# Patient Record
Sex: Male | Born: 1950 | Race: White | Hispanic: No | Marital: Married | Smoking: Former smoker
Health system: Southern US, Community
[De-identification: ages and names within clinical notes are randomized; demographics above are authoritative.]

## PROBLEM LIST (undated history)

## (undated) DIAGNOSIS — I10 Essential (primary) hypertension: Secondary | ICD-10-CM

## (undated) DIAGNOSIS — G4730 Sleep apnea, unspecified: Secondary | ICD-10-CM

## (undated) DIAGNOSIS — K219 Gastro-esophageal reflux disease without esophagitis: Secondary | ICD-10-CM

## (undated) DIAGNOSIS — R011 Cardiac murmur, unspecified: Secondary | ICD-10-CM

## (undated) DIAGNOSIS — E785 Hyperlipidemia, unspecified: Secondary | ICD-10-CM

## (undated) DIAGNOSIS — R1310 Dysphagia, unspecified: Secondary | ICD-10-CM

## (undated) DIAGNOSIS — K5792 Diverticulitis of intestine, part unspecified, without perforation or abscess without bleeding: Secondary | ICD-10-CM

## (undated) DIAGNOSIS — M5030 Other cervical disc degeneration, unspecified cervical region: Secondary | ICD-10-CM

## (undated) DIAGNOSIS — G500 Trigeminal neuralgia: Secondary | ICD-10-CM

## (undated) HISTORY — PX: KNEE ARTHROSCOPY: SHX127

## (undated) HISTORY — PX: SHOULDER ARTHROSCOPY: SHX128

## (undated) HISTORY — PX: NOSE SURGERY: SHX723

## (undated) HISTORY — PX: TYMPANOPLASTY: SHX33

## (undated) HISTORY — PX: WISDOM TOOTH EXTRACTION: SHX21

## (undated) HISTORY — PX: TOTAL SHOULDER ARTHROPLASTY: SHX126

---

## 2004-08-11 ENCOUNTER — Ambulatory Visit: Payer: Self-pay | Admitting: Family Medicine

## 2004-08-18 ENCOUNTER — Ambulatory Visit: Payer: Self-pay | Admitting: Family Medicine

## 2005-05-21 ENCOUNTER — Ambulatory Visit: Payer: Self-pay

## 2005-09-02 ENCOUNTER — Ambulatory Visit: Payer: Self-pay | Admitting: Orthopaedic Surgery

## 2005-11-27 ENCOUNTER — Ambulatory Visit: Payer: Self-pay | Admitting: Orthopaedic Surgery

## 2005-12-21 ENCOUNTER — Ambulatory Visit: Payer: Self-pay | Admitting: Orthopaedic Surgery

## 2008-10-25 ENCOUNTER — Ambulatory Visit: Payer: Self-pay | Admitting: Unknown Physician Specialty

## 2012-02-28 DEATH — deceased

## 2013-05-21 ENCOUNTER — Emergency Department: Payer: Self-pay | Admitting: Emergency Medicine

## 2013-05-21 LAB — COMPREHENSIVE METABOLIC PANEL
Albumin: 4.2 g/dL (ref 3.4–5.0)
Alkaline Phosphatase: 79 U/L (ref 50–136)
Anion Gap: 5 — ABNORMAL LOW (ref 7–16)
Bilirubin,Total: 0.3 mg/dL (ref 0.2–1.0)
Calcium, Total: 9.2 mg/dL (ref 8.5–10.1)
Chloride: 104 mmol/L (ref 98–107)
Co2: 27 mmol/L (ref 21–32)
Creatinine: 0.98 mg/dL (ref 0.60–1.30)
EGFR (African American): >60
EGFR (Non-African Amer.): >60
Glucose: 132 mg/dL — ABNORMAL HIGH (ref 65–99)
Osmolality: 274 (ref 275–301)
SGOT(AST): 32 U/L (ref 15–37)
SGPT (ALT): 30 U/L (ref 12–78)
Total Protein: 7 g/dL (ref 6.4–8.2)

## 2013-05-21 LAB — CK TOTAL AND CKMB (NOT AT ARMC)
CK, Total: 320 U/L — ABNORMAL HIGH (ref 35–232)
CK-MB: 1.4 ng/mL (ref 0.5–3.6)

## 2013-05-21 LAB — CBC
HCT: 42.1 % (ref 40.0–52.0)
Platelet: 201 10*3/uL (ref 150–440)
RBC: 4.55 10*6/uL (ref 4.40–5.90)
WBC: 9.6 10*3/uL (ref 3.8–10.6)

## 2013-05-21 LAB — TROPONIN I: Troponin-I: <0.02 ng/mL

## 2014-06-25 ENCOUNTER — Emergency Department: Payer: Self-pay | Admitting: Emergency Medicine

## 2015-06-12 ENCOUNTER — Ambulatory Visit: Payer: Federal, State, Local not specified - PPO | Attending: Otolaryngology

## 2015-06-12 DIAGNOSIS — G4733 Obstructive sleep apnea (adult) (pediatric): Secondary | ICD-10-CM | POA: Diagnosis not present

## 2015-06-12 DIAGNOSIS — R0683 Snoring: Secondary | ICD-10-CM | POA: Diagnosis not present

## 2015-06-19 ENCOUNTER — Ambulatory Visit: Payer: Federal, State, Local not specified - PPO | Attending: Otolaryngology

## 2015-06-19 DIAGNOSIS — G4733 Obstructive sleep apnea (adult) (pediatric): Secondary | ICD-10-CM | POA: Insufficient documentation

## 2015-06-19 DIAGNOSIS — R0683 Snoring: Secondary | ICD-10-CM | POA: Insufficient documentation

## 2015-08-13 ENCOUNTER — Encounter: Payer: Self-pay | Admitting: *Deleted

## 2015-08-14 ENCOUNTER — Ambulatory Visit
Admission: RE | Admit: 2015-08-14 | Discharge: 2015-08-14 | Disposition: A | Discharge status: Home / Self Care | Payer: Federal, State, Local not specified - PPO | Source: Ambulatory Visit | Attending: Unknown Physician Specialty | Admitting: Unknown Physician Specialty

## 2015-08-14 ENCOUNTER — Ambulatory Visit: Payer: Federal, State, Local not specified - PPO | Admitting: Anesthesiology

## 2015-08-14 ENCOUNTER — Encounter
Admission: RE | Disposition: A | Discharge status: Home / Self Care | Payer: Self-pay | Source: Ambulatory Visit | Attending: Unknown Physician Specialty

## 2015-08-14 ENCOUNTER — Encounter: Payer: Self-pay | Admitting: *Deleted

## 2015-08-14 DIAGNOSIS — K625 Hemorrhage of anus and rectum: Secondary | ICD-10-CM | POA: Insufficient documentation

## 2015-08-14 DIAGNOSIS — K222 Esophageal obstruction: Secondary | ICD-10-CM | POA: Insufficient documentation

## 2015-08-14 DIAGNOSIS — K449 Diaphragmatic hernia without obstruction or gangrene: Secondary | ICD-10-CM | POA: Insufficient documentation

## 2015-08-14 DIAGNOSIS — R1310 Dysphagia, unspecified: Secondary | ICD-10-CM | POA: Diagnosis not present

## 2015-08-14 DIAGNOSIS — E785 Hyperlipidemia, unspecified: Secondary | ICD-10-CM | POA: Diagnosis not present

## 2015-08-14 DIAGNOSIS — Z7982 Long term (current) use of aspirin: Secondary | ICD-10-CM | POA: Insufficient documentation

## 2015-08-14 DIAGNOSIS — I10 Essential (primary) hypertension: Secondary | ICD-10-CM | POA: Insufficient documentation

## 2015-08-14 DIAGNOSIS — Z79899 Other long term (current) drug therapy: Secondary | ICD-10-CM | POA: Diagnosis not present

## 2015-08-14 DIAGNOSIS — K640 First degree hemorrhoids: Secondary | ICD-10-CM | POA: Insufficient documentation

## 2015-08-14 DIAGNOSIS — K219 Gastro-esophageal reflux disease without esophagitis: Secondary | ICD-10-CM | POA: Insufficient documentation

## 2015-08-14 HISTORY — DX: Trigeminal neuralgia: G50.0

## 2015-08-14 HISTORY — DX: Diverticulitis of intestine, part unspecified, without perforation or abscess without bleeding: K57.92

## 2015-08-14 HISTORY — PX: ESOPHAGOGASTRODUODENOSCOPY (EGD) WITH PROPOFOL: SHX5813

## 2015-08-14 HISTORY — PX: COLONOSCOPY WITH PROPOFOL: SHX5780

## 2015-08-14 HISTORY — DX: Dysphagia, unspecified: R13.10

## 2015-08-14 HISTORY — DX: Hyperlipidemia, unspecified: E78.5

## 2015-08-14 HISTORY — DX: Gastro-esophageal reflux disease without esophagitis: K21.9

## 2015-08-14 HISTORY — DX: Essential (primary) hypertension: I10

## 2015-08-14 SURGERY — COLONOSCOPY WITH PROPOFOL
Anesthesia: General

## 2015-08-14 MED ORDER — LIDOCAINE HCL (CARDIAC) 20 MG/ML IV SOLN
INTRAVENOUS | Status: DC | PRN
  Administered 2015-08-14: 100 mg via INTRAVENOUS

## 2015-08-14 MED ORDER — PHENYLEPHRINE HCL 10 MG/ML IJ SOLN
INTRAMUSCULAR | Status: DC | PRN
  Administered 2015-08-14: 100 ug via INTRAVENOUS

## 2015-08-14 MED ORDER — SODIUM CHLORIDE 0.9 % IV SOLN
INTRAVENOUS | Status: DC
  Administered 2015-08-14: 13:00:00 via INTRAVENOUS

## 2015-08-14 MED ORDER — FENTANYL CITRATE (PF) 100 MCG/2ML IJ SOLN
INTRAMUSCULAR | Status: DC | PRN
  Administered 2015-08-14: 50 ug via INTRAVENOUS

## 2015-08-14 MED ORDER — GLYCOPYRROLATE 0.2 MG/ML IJ SOLN
INTRAMUSCULAR | Status: DC | PRN
  Administered 2015-08-14: 0.2 mg via INTRAVENOUS

## 2015-08-14 MED ORDER — MIDAZOLAM HCL 5 MG/5ML IJ SOLN
INTRAMUSCULAR | Status: DC | PRN
  Administered 2015-08-14: 1 mg via INTRAVENOUS

## 2015-08-14 MED ORDER — PROPOFOL 10 MG/ML IV BOLUS
INTRAVENOUS | Status: DC | PRN
  Administered 2015-08-14: 100 mg via INTRAVENOUS

## 2015-08-14 MED ORDER — ACETAMINOPHEN 325 MG PO TABS
650.0000 mg | ORAL_TABLET | Freq: Once | ORAL | Status: AC
  Administered 2015-08-14: 650 mg via ORAL
  Filled 2015-08-14: qty 2

## 2015-08-14 MED ORDER — SODIUM CHLORIDE 0.9 % IV SOLN: INTRAVENOUS | Status: DC

## 2015-08-14 MED ORDER — PROPOFOL 500 MG/50ML IV EMUL
INTRAVENOUS | Status: DC | PRN
  Administered 2015-08-14: 200 ug/kg/min via INTRAVENOUS

## 2015-08-14 NOTE — Op Note (Signed)
[facility] Patient Name: Maurice James Procedure Date: 08/14/2015 1:29 PM MRN: [id number] Account #: [id number] Date of Birth: 12/31/1950 Admit Type: Outpatient Age: 64 Room: [facility] ENDO ROOM 1 Gender: Male Note Status: Finalized Procedure:         Upper GI endoscopy Indications:       Dysphagia Providers:         Manya Silvas, MD Referring MD:      Sofie Hartigan (Referring MD) Medicines:         Propofol per Anesthesia Complications:     No immediate complications. Procedure:         Pre-Anesthesia Assessment:                    - After reviewing the risks and benefits, the patient was                     deemed in satisfactory condition to undergo the procedure.                    After obtaining informed consent, the endoscope was passed                     under direct vision. Throughout the procedure, the                     patient's blood pressure, pulse, and oxygen saturations                     were monitored continuously. The Olympus GIF-160 endoscope                     (S#. [id number]) was introduced through the mouth, and                     advanced to the second part of duodenum. The upper GI                     endoscopy was accomplished without difficulty. The patient                     tolerated the procedure well. Findings:      A mild Schatzki ring (acquired) was found at the gastroesophageal       junction. A guidewire was placed and the scope was withdrawn. Dilation       was performed with a Savary dilator with mild resistance at 17 mm.      A small hiatus hernia was present. Stomach otherwise normal.      The examined duodenum was normal. Impression:        - Mild Schatzki ring. Dilated.                    - Small hiatus hernia.                    - Normal examined duodenum.                    - No specimens collected. Recommendation:    - Perform a colonoscopy as previously scheduled. Manya Silvas,  MD 08/14/2015 1:42:41 PM This report has been signed electronically. Number of Addenda: 0 Note Initiated On: 08/14/2015 1:29 PM      [facility]

## 2015-08-14 NOTE — H&P (Signed)
Primary Care Physician:  [facility], MD Primary Gastroenterologist:  Dr. Vira Agar  Pre-Procedure History & Physical: HPI:  Maurice James is a 64 y.o. male is here for an endoscopy and colonoscopy.   Past Medical History  Diagnosis Date  . GERD (gastroesophageal reflux disease)   . Hypertension   . Trigeminal neuralgia   . Hyperlipidemia   . Diverticulitis   . Dysphagia     Past Surgical History  Procedure Laterality Date  . Tympanoplasty    . Nose surgery      Turbinectomy  . Total shoulder arthroplasty Left   . Knee arthroscopy Left   . Wisdom tooth extraction      Prior to Admission medications   Medication Sig Start Date End Date Taking? Authorizing Provider  aspirin EC 81 MG tablet Take 81 mg by mouth daily.   Yes Historical Provider, MD  carbamazepine (TEGRETOL) 200 MG tablet Take 200 mg by mouth 3 (three) times daily.   Yes Historical Provider, MD  clonazePAM (KLONOPIN) 0.5 MG tablet Take 0.5 mg by mouth 2 (two) times daily as needed for anxiety.   Yes Historical Provider, MD  FLUoxetine (PROZAC) 10 MG capsule Take 10 mg by mouth daily.   Yes Historical Provider, MD  methocarbamol (ROBAXIN) 750 MG tablet Take 750 mg by mouth 2 (two) times daily as needed for muscle spasms.   Yes Historical Provider, MD  simvastatin (ZOCOR) 20 MG tablet Take 20 mg by mouth at bedtime.   Yes Historical Provider, MD  diphenhydrAMINE (SOMINEX) 25 MG tablet Take 25 mg by mouth at bedtime as needed for itching or sleep. Reported on 08/14/2015    Historical Provider, MD  famotidine (PEPCID) 40 MG tablet Take 40 mg by mouth daily. Reported on 08/14/2015    Historical Provider, MD  naproxen (NAPROSYN) 500 MG tablet Take 500 mg by mouth 2 (two) times daily as needed. Reported on 08/14/2015    Historical Provider, MD  traMADol (ULTRAM) 50 MG tablet Take by mouth every 6 (six) hours as needed for moderate pain. Reported on 08/14/2015    Historical Provider, MD    Allergies as of  08/07/2015  . (Not on File)    History reviewed. No pertinent family history.  Social History   Social History  . Marital Status: Married    Spouse Name: N/A  . Number of Children: N/A  . Years of Education: N/A   Occupational History  . Not on file.   Social History Main Topics  . Smoking status: Not on file  . Smokeless tobacco: Not on file  . Alcohol Use: Not on file  . Drug Use: Not on file  . Sexual Activity: Not on file   Other Topics Concern  . Not on file   Social History Narrative    Review of Systems: See HPI, otherwise negative ROS  Physical Exam: BP 159/90 mmHg  Pulse 72  Temp(Src) 97.1 F (36.2 C) (Tympanic)  Resp 20  Ht 5\' 8"  (1.727 m)  Wt 80.74 kg (178 lb)  BMI 27.07 kg/m2  SpO2 99% General:   Alert,  pleasant and cooperative in NAD Head:  Normocephalic and atraumatic. Neck:  Supple; no masses or thyromegaly. Lungs:  Clear throughout to auscultation.    Heart:  Regular rate and rhythm. Abdomen:  Soft, nontender and nondistended. Normal bowel sounds, without guarding, and without rebound.   Neurologic:  Alert and  oriented x4;  grossly normal neurologically.  Impression/Plan: Maurice James is  here for an endoscopy and colonoscopy to be performed for dysphagia and rectal bleeding  Risks, benefits, limitations, and alternatives regarding  endoscopy and colonoscopy have been reviewed with the patient.  Questions have been answered.  All parties agreeable.   Gaylyn Cheers, MD  08/14/2015, 1:27 PM

## 2015-08-14 NOTE — Anesthesia Postprocedure Evaluation (Signed)
Anesthesia Post Note  Patient: Maurice James  Procedure(s) Performed: Procedure(s) (LRB): COLONOSCOPY WITH PROPOFOL (N/A) ESOPHAGOGASTRODUODENOSCOPY (EGD) WITH PROPOFOL (N/A)  Patient location during evaluation: Endoscopy Anesthesia Type: General Level of consciousness: awake Pain management: pain level controlled Vital Signs Assessment: post-procedure vital signs reviewed and stable Respiratory status: spontaneous breathing Cardiovascular status: blood pressure returned to baseline Anesthetic complications: no    Last Vitals:  Filed Vitals:   08/14/15 1445 08/14/15 1455  BP: 140/91 146/92  Pulse: 68 69  Temp:    Resp: 15 16    Last Pain:  Filed Vitals:   08/14/15 1457  PainSc: [place]

## 2015-08-14 NOTE — Transfer of Care (Signed)
Immediate Anesthesia Transfer of Care Note  Patient: Maurice James  Procedure(s) Performed: Procedure(s): COLONOSCOPY WITH PROPOFOL (N/A) ESOPHAGOGASTRODUODENOSCOPY (EGD) WITH PROPOFOL (N/A)  Patient Location: PACU  Anesthesia Type:General  Level of Consciousness: sedated  Airway & Oxygen Therapy: Patient Spontanous Breathing and Patient connected to nasal cannula oxygen  Post-op Assessment: Report given to RN and Post -op Vital signs reviewed and stable  Post vital signs: Reviewed and stable  Last Vitals:  Filed Vitals:   08/14/15 1233 08/14/15 1415  BP: 159/90   Pulse: 72   Temp: 36.2 C 36.3 C  Resp: 20     Complications: No apparent anesthesia complications

## 2015-08-14 NOTE — Anesthesia Preprocedure Evaluation (Signed)
Anesthesia Evaluation  Patient identified by MRN, date of birth, ID band Patient awake    Reviewed: Allergy & Precautions, NPO status   Airway Mallampati: II       Dental  (+) Teeth Intact   Pulmonary neg pulmonary ROS,    Pulmonary exam normal        Cardiovascular hypertension,  Rhythm:Regular     Neuro/Psych    GI/Hepatic GERD  ,  Endo/Other    Renal/GU      Musculoskeletal   Abdominal Normal abdominal exam  (+)   Peds  Hematology   Anesthesia Other Findings   Reproductive/Obstetrics                             Anesthesia Physical Anesthesia Plan  ASA: II  Anesthesia Plan: General   Post-op Pain Management:    Induction: Intravenous  Airway Management Planned: Nasal Cannula  Additional Equipment:   Intra-op Plan:   Post-operative Plan:   Informed Consent: I have reviewed the patients History and Physical, chart, labs and discussed the procedure including the risks, benefits and alternatives for the proposed anesthesia with the patient or authorized representative who has indicated his/her understanding and acceptance.     Plan Discussed with: CRNA  Anesthesia Plan Comments:         Anesthesia Quick Evaluation

## 2015-08-14 NOTE — Op Note (Signed)
[facility]enter[facility] Patient Name: Maurice James Procedure Date: 08/14/2015 1:13 PM MRN: [id number] Account #: [id number] Date of Birth: 13-Sep-1950 Admit Type: Outpatient Age: 64 Room: [facility] ENDO ROOM 1 Gender: Male Note Status: Finalized Procedure:         Colonoscopy Indications:       Rectal bleeding Providers:         Manya Silvas, MD Referring MD:      Sofie Hartigan (Referring MD) Medicines:         Propofol per Anesthesia Complications:     No immediate complications. Procedure:         Pre-Anesthesia Assessment:                    - After reviewing the risks and benefits, the patient was                     deemed in satisfactory condition to undergo the procedure.                    After obtaining informed consent, the colonoscope was                     passed under direct vision. Throughout the procedure, the                     patient's blood pressure, pulse, and oxygen saturations                     were monitored continuously. The Colonoscope was                     introduced through the anus and advanced to the the cecum,                     identified by appendiceal orifice and ileocecal valve. The                     colonoscopy was performed without difficulty. The patient                     tolerated the procedure well. The quality of the bowel                     preparation was excellent. Findings:      Three small angioectasias without bleeding were found in the cecum.       Coagulation for tissue destruction using argon plasma at 0.5       liters/minute and 20 watts was successful.      Internal hemorrhoids were found during endoscopy. The hemorrhoids were       small and Grade I (internal hemorrhoids that do not prolapse).      The exam was otherwise without abnormality. Impression:        - Three non-bleeding colonic angioectasias. Treated with                     argon plasma coagulation (APC).                    -  Internal hemorrhoids.                    - The examination was otherwise normal.                    -  No specimens collected. Recommendation:    - Repeat colonoscopy in 10 years for screening purposes. Manya Silvas, MD 08/14/2015 2:24:35 PM This report has been signed electronically. Number of Addenda: 0 Note Initiated On: 08/14/2015 1:13 PM Scope Withdrawal Time: 0 hours 25 minutes 53 seconds  Total Procedure Duration: 0 hours 36 minutes 33 seconds       [employer]

## 2015-08-16 ENCOUNTER — Encounter: Payer: Self-pay | Admitting: Unknown Physician Specialty
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
## 2019-05-03 ENCOUNTER — Encounter
Admission: RE | Admit: 2019-05-03 | Discharge: 2019-05-03 | Disposition: A | Discharge status: Home / Self Care | Payer: Medicare HMO | Source: Ambulatory Visit | Attending: Surgery | Admitting: Surgery

## 2019-05-03 ENCOUNTER — Other Ambulatory Visit: Payer: Self-pay

## 2019-05-03 DIAGNOSIS — I10 Essential (primary) hypertension: Secondary | ICD-10-CM | POA: Insufficient documentation

## 2019-05-03 DIAGNOSIS — Z01812 Encounter for preprocedural laboratory examination: Secondary | ICD-10-CM | POA: Insufficient documentation

## 2019-05-03 DIAGNOSIS — Z20828 Contact with and (suspected) exposure to other viral communicable diseases: Secondary | ICD-10-CM | POA: Insufficient documentation

## 2019-05-03 HISTORY — DX: Sleep apnea, unspecified: G47.30

## 2019-05-03 HISTORY — DX: Other cervical disc degeneration, unspecified cervical region: M50.30

## 2019-05-03 HISTORY — DX: Cardiac murmur, unspecified: R01.1

## 2019-05-03 NOTE — Patient Instructions (Signed)
Your procedure is scheduled on: 05-09-19 [facility] Report to Same Day Surgery 2nd floor medical mall [facility] Entrance-take elevator on left to 2nd floor.  Check in with surgery information desk.) To find out your arrival time please call [phone] between 1PM - 3PM on 05-08-19 TUESDAY  Remember: Instructions that are not followed completely may result in serious medical risk, up to and including death, or upon the discretion of your surgeon and anesthesiologist your surgery may need to be rescheduled.    _x___ 1. Do not eat food after midnight the night before your procedure. NO GUM OR CANDY AFTER MIDNIGHT. You may drink clear liquids up to 2 hours before you are scheduled to arrive at the hospital for your procedure.  Do not drink clear liquids within 2 hours of your scheduled arrival to the hospital.  Clear liquids include  --Water or Apple juice without pulp  --Clear carbohydrate beverage such as ClearFast or Gatorade  --Black Coffee or Clear Tea (No milk, no creamers, do not add anything to the coffee or Tea   ____Ensure clear carbohydrate drink on the way to the hospital for bariatric patients  ____Ensure clear carbohydrate drink 3 hours before surgery.    __x__ 2. No Alcohol for 24 hours before or after surgery.   __x__3. No Smoking or e-cigarettes for 24 prior to surgery.  Do not use any chewable tobacco products for at least 6 hour prior to surgery   ____  4. Bring all medications with you on the day of surgery if instructed.    __x__ 5. Notify your doctor if there is any change in your medical condition     (cold, fever, infections).    x___6. On the morning of surgery brush your teeth with toothpaste and water.  You may rinse your mouth with mouth wash if you wish.  Do not swallow any toothpaste or mouthwash.   Do not wear jewelry, make-up, hairpins, clips or nail polish.  Do not wear lotions, powders, or perfumes. You may wear deodorant.  Do not shave 48 hours prior  to surgery. Men may shave face and neck.  Do not bring valuables to the hospital.    [facility] is not responsible for any belongings or valuables.               Contacts, dentures or bridgework may not be worn into surgery.  Leave your suitcase in the car. After surgery it may be brought to your room.  For patients admitted to the hospital, discharge time is determined by your treatment team.  _  Patients discharged the day of surgery will not be allowed to drive home.  You will need someone to drive you home and stay with you the night of your procedure.    Please read over the following fact sheets that you were given:   [facility] Preparing for Surgery  _x___ TAKE THE FOLLOWING MEDICATION THE MORNING OF SURGERY WITH A SMALL SIP OF WATER. These include:  1. TEGRETOL (CARBAMAZEPINE)  2.  PROZAC (FLUOXETINE)  3. PROTONIX (PANTOPRAZOLE)  4. TAKE AN EXTRA PROTONIX THE NIGHT BEFORE YOUR SURGERY  5.  6.  ____Fleets enema or Magnesium Citrate as directed.   _x___ Use CHG Soap or sage wipes as directed on instruction sheet   ____ Use inhalers on the day of surgery and bring to hospital day of surgery  ____ Stop Metformin and Janumet 2 days prior to surgery.    ____ Take 1/2 of usual  insulin dose the night before surgery and none on the morning surgery.   _x___ Follow recommendations from Cardiologist, Pulmonologist or PCP regarding stopping Aspirin, Coumadin, Plavix ,Eliquis, Effient, or Pradaxa, and Pletal-STOP YOUR ASPIRIN NOW  X____Stop Anti-inflammatories such as Advil, Aleve, Ibuprofen, Motrin, Naproxen, Naprosyn, Goodies powders or aspirin products NOW-OK to take Tylenol    ____ Stop supplements until after surgery.    _X___ Bring C-Pap to the hospital.

## 2019-05-04 ENCOUNTER — Other Ambulatory Visit: Admission: RE | Admit: 2019-05-04 | Payer: Medicare HMO | Source: Ambulatory Visit

## 2019-05-04 ENCOUNTER — Encounter
Admission: RE | Admit: 2019-05-04 | Discharge: 2019-05-04 | Disposition: A | Discharge status: Home / Self Care | Payer: Medicare HMO | Source: Ambulatory Visit | Attending: Surgery | Admitting: Surgery

## 2019-05-04 ENCOUNTER — Other Ambulatory Visit: Payer: Self-pay

## 2019-05-04 DIAGNOSIS — Z01812 Encounter for preprocedural laboratory examination: Secondary | ICD-10-CM | POA: Diagnosis not present

## 2019-05-04 LAB — SARS CORONAVIRUS 2 (TAT 6-24 HRS): SARS Coronavirus 2: NEGATIVE

## 2019-05-04 LAB — POTASSIUM: Potassium: 4 mmol/L (ref 3.5–5.1)

## 2019-05-08 ENCOUNTER — Encounter: Payer: Self-pay | Admitting: Anesthesiology

## 2019-05-08 MED ORDER — CEFAZOLIN SODIUM-DEXTROSE 2-4 GM/100ML-% IV SOLN
2.0000 g | Freq: Once | INTRAVENOUS | Status: AC
  Administered 2019-05-09: 2 g via INTRAVENOUS

## 2019-05-09 ENCOUNTER — Ambulatory Visit
Admission: RE | Admit: 2019-05-09 | Discharge: 2019-05-09 | Disposition: A | Discharge status: Home / Self Care | Payer: Medicare HMO | Attending: Surgery | Admitting: Surgery

## 2019-05-09 ENCOUNTER — Ambulatory Visit: Payer: Medicare HMO | Admitting: Anesthesiology

## 2019-05-09 ENCOUNTER — Other Ambulatory Visit: Payer: Self-pay

## 2019-05-09 ENCOUNTER — Encounter
Admission: RE | Disposition: A | Discharge status: Home / Self Care | Payer: Self-pay | Source: Home / Self Care | Attending: Surgery

## 2019-05-09 DIAGNOSIS — G709 Myoneural disorder, unspecified: Secondary | ICD-10-CM | POA: Diagnosis not present

## 2019-05-09 DIAGNOSIS — M1990 Unspecified osteoarthritis, unspecified site: Secondary | ICD-10-CM | POA: Diagnosis not present

## 2019-05-09 DIAGNOSIS — I10 Essential (primary) hypertension: Secondary | ICD-10-CM | POA: Insufficient documentation

## 2019-05-09 DIAGNOSIS — K219 Gastro-esophageal reflux disease without esophagitis: Secondary | ICD-10-CM | POA: Diagnosis not present

## 2019-05-09 DIAGNOSIS — G4730 Sleep apnea, unspecified: Secondary | ICD-10-CM | POA: Insufficient documentation

## 2019-05-09 DIAGNOSIS — Z87891 Personal history of nicotine dependence: Secondary | ICD-10-CM | POA: Diagnosis not present

## 2019-05-09 DIAGNOSIS — G5603 Carpal tunnel syndrome, bilateral upper limbs: Secondary | ICD-10-CM | POA: Diagnosis not present

## 2019-05-09 DIAGNOSIS — E785 Hyperlipidemia, unspecified: Secondary | ICD-10-CM | POA: Insufficient documentation

## 2019-05-09 DIAGNOSIS — M5030 Other cervical disc degeneration, unspecified cervical region: Secondary | ICD-10-CM | POA: Diagnosis not present

## 2019-05-09 HISTORY — PX: CARPAL TUNNEL RELEASE: SHX101

## 2019-05-09 SURGERY — RELEASE, CARPAL TUNNEL, ENDOSCOPIC
Anesthesia: General | Site: Wrist | Laterality: Right

## 2019-05-09 MED ORDER — CEFAZOLIN SODIUM-DEXTROSE 2-4 GM/100ML-% IV SOLN
INTRAVENOUS | Status: AC
  Filled 2019-05-09: qty 100

## 2019-05-09 MED ORDER — PROPOFOL 10 MG/ML IV BOLUS
INTRAVENOUS | Status: AC
  Filled 2019-05-09: qty 40

## 2019-05-09 MED ORDER — ONDANSETRON HCL 4 MG/2ML IJ SOLN: 4.0000 mg | Freq: Once | INTRAMUSCULAR | Status: DC | PRN

## 2019-05-09 MED ORDER — POTASSIUM CHLORIDE IN NACL 20-0.9 MEQ/L-% IV SOLN
INTRAVENOUS | Status: DC
  Filled 2019-05-09: qty 1000

## 2019-05-09 MED ORDER — FENTANYL CITRATE (PF) 100 MCG/2ML IJ SOLN
INTRAMUSCULAR | Status: DC | PRN
  Administered 2019-05-09: 50 ug via INTRAVENOUS

## 2019-05-09 MED ORDER — ONDANSETRON HCL 4 MG/2ML IJ SOLN
INTRAMUSCULAR | Status: AC
  Filled 2019-05-09: qty 2

## 2019-05-09 MED ORDER — METOCLOPRAMIDE HCL 5 MG/ML IJ SOLN: 5.0000 mg | Freq: Three times a day (TID) | INTRAMUSCULAR | Status: DC | PRN

## 2019-05-09 MED ORDER — DEXAMETHASONE SODIUM PHOSPHATE 10 MG/ML IJ SOLN
INTRAMUSCULAR | Status: DC | PRN
  Administered 2019-05-09: 5 mg via INTRAVENOUS

## 2019-05-09 MED ORDER — BUPIVACAINE HCL (PF) 0.5 % IJ SOLN
INTRAMUSCULAR | Status: DC | PRN
  Administered 2019-05-09: 10 mL

## 2019-05-09 MED ORDER — FENTANYL CITRATE (PF) 100 MCG/2ML IJ SOLN
25.0000 ug | INTRAMUSCULAR | Status: DC | PRN
  Administered 2019-05-09 (×4): 25 ug via INTRAVENOUS

## 2019-05-09 MED ORDER — EPHEDRINE SULFATE 50 MG/ML IJ SOLN
INTRAMUSCULAR | Status: DC | PRN
  Administered 2019-05-09: 5 mg via INTRAVENOUS

## 2019-05-09 MED ORDER — TRAMADOL HCL 50 MG PO TABS: 50.0000 mg | ORAL_TABLET | Freq: Four times a day (QID) | ORAL | 0 refills | Status: AC | PRN

## 2019-05-09 MED ORDER — BUPIVACAINE HCL (PF) 0.5 % IJ SOLN
INTRAMUSCULAR | Status: AC
  Filled 2019-05-09: qty 30

## 2019-05-09 MED ORDER — LIDOCAINE HCL (CARDIAC) PF 100 MG/5ML IV SOSY
PREFILLED_SYRINGE | INTRAVENOUS | Status: DC | PRN
  Administered 2019-05-09: 100 mg via INTRAVENOUS

## 2019-05-09 MED ORDER — FENTANYL CITRATE (PF) 100 MCG/2ML IJ SOLN
INTRAMUSCULAR | Status: AC
  Administered 2019-05-09: 25 ug via INTRAVENOUS
  Filled 2019-05-09: qty 2

## 2019-05-09 MED ORDER — ONDANSETRON HCL 4 MG PO TABS: 4.0000 mg | ORAL_TABLET | Freq: Four times a day (QID) | ORAL | Status: DC | PRN

## 2019-05-09 MED ORDER — TRAMADOL HCL 50 MG PO TABS
50.0000 mg | ORAL_TABLET | Freq: Four times a day (QID) | ORAL | Status: DC
  Filled 2019-05-09: qty 1

## 2019-05-09 MED ORDER — GLYCOPYRROLATE 0.2 MG/ML IJ SOLN
INTRAMUSCULAR | Status: DC | PRN
  Administered 2019-05-09: 0.2 mg via INTRAVENOUS

## 2019-05-09 MED ORDER — FENTANYL CITRATE (PF) 250 MCG/5ML IJ SOLN
INTRAMUSCULAR | Status: AC
  Filled 2019-05-09: qty 5

## 2019-05-09 MED ORDER — EPHEDRINE SULFATE 50 MG/ML IJ SOLN
INTRAMUSCULAR | Status: AC
  Filled 2019-05-09: qty 1

## 2019-05-09 MED ORDER — METOCLOPRAMIDE HCL 10 MG PO TABS: 5.0000 mg | ORAL_TABLET | Freq: Three times a day (TID) | ORAL | Status: DC | PRN

## 2019-05-09 MED ORDER — TRAMADOL HCL 50 MG PO TABS
ORAL_TABLET | ORAL | Status: AC
  Administered 2019-05-09: 50 mg
  Filled 2019-05-09: qty 1

## 2019-05-09 MED ORDER — LACTATED RINGERS IV SOLN
INTRAVENOUS | Status: DC
  Administered 2019-05-09: 08:00:00 via INTRAVENOUS

## 2019-05-09 MED ORDER — PROPOFOL 10 MG/ML IV BOLUS
INTRAVENOUS | Status: DC | PRN
  Administered 2019-05-09: 150 mg via INTRAVENOUS

## 2019-05-09 MED ORDER — LIDOCAINE HCL (PF) 2 % IJ SOLN
INTRAMUSCULAR | Status: AC
  Filled 2019-05-09: qty 10

## 2019-05-09 MED ORDER — ONDANSETRON HCL 4 MG/2ML IJ SOLN: 4.0000 mg | Freq: Four times a day (QID) | INTRAMUSCULAR | Status: DC | PRN

## 2019-05-09 MED ORDER — ONDANSETRON HCL 4 MG/2ML IJ SOLN
INTRAMUSCULAR | Status: DC | PRN
  Administered 2019-05-09: 4 mg via INTRAVENOUS

## 2019-05-09 SURGICAL SUPPLY — 29 items
BNDG COHESIVE 4X5 TAN STRL (GAUZE/BANDAGES/DRESSINGS) ×3 IMPLANT
BNDG ELASTIC 2X5.8 VLCR STR LF (GAUZE/BANDAGES/DRESSINGS) ×3 IMPLANT
BNDG ESMARK 4X12 TAN STRL LF (GAUZE/BANDAGES/DRESSINGS) ×3 IMPLANT
CANISTER SUCT 1200ML W/VALVE (MISCELLANEOUS) ×3 IMPLANT
CHLORAPREP W/TINT 26 (MISCELLANEOUS) ×3 IMPLANT
CORD BIP STRL DISP 12FT (MISCELLANEOUS) ×3 IMPLANT
COVER WAND RF STERILE (DRAPES) ×3 IMPLANT
CUFF TOURN 18 STER (MISCELLANEOUS) ×3 IMPLANT
DRAPE SURG 17X11 SM STRL (DRAPES) ×3 IMPLANT
FORCEPS JEWEL BIP 4-3/4 STR (INSTRUMENTS) ×3 IMPLANT
GAUZE SPONGE 4X4 12PLY STRL (GAUZE/BANDAGES/DRESSINGS) ×3 IMPLANT
GAUZE XEROFORM 1X8 LF (GAUZE/BANDAGES/DRESSINGS) ×3 IMPLANT
GLOVE BIO SURGEON STRL SZ8 (GLOVE) ×6 IMPLANT
GLOVE INDICATOR 8.0 STRL GRN (GLOVE) ×6 IMPLANT
GOWN STRL REUS W/ TWL LRG LVL3 (GOWN DISPOSABLE) ×2 IMPLANT
GOWN STRL REUS W/ TWL XL LVL3 (GOWN DISPOSABLE) ×1 IMPLANT
GOWN STRL REUS W/TWL LRG LVL3 (GOWN DISPOSABLE) ×4
GOWN STRL REUS W/TWL XL LVL3 (GOWN DISPOSABLE) ×2
KIT CARPAL TUNNEL (MISCELLANEOUS) ×2
KIT ESCP INSRT D SLOT CANN KN (MISCELLANEOUS) ×1 IMPLANT
KIT TURNOVER KIT A (KITS) ×3 IMPLANT
NS IRRIG 500ML POUR BTL (IV SOLUTION) ×3 IMPLANT
PACK EXTREMITY ARMC (MISCELLANEOUS) ×3 IMPLANT
SPLINT WRIST LG LT TX990309 (SOFTGOODS) IMPLANT
SPLINT WRIST LG RT TX900304 (SOFTGOODS) ×3 IMPLANT
SPLINT WRIST M LT TX990308 (SOFTGOODS) IMPLANT
SPLINT WRIST M RT TX990303 (SOFTGOODS) IMPLANT
STOCKINETTE IMPERVIOUS 9X36 MD (GAUZE/BANDAGES/DRESSINGS) ×3 IMPLANT
SUT PROLENE 4 0 PS 2 18 (SUTURE) ×3 IMPLANT

## 2019-05-09 NOTE — Anesthesia Postprocedure Evaluation (Signed)
Anesthesia Post Note  Patient: Maurice James  Procedure(s) Performed: CARPAL TUNNEL RELEASE ENDOSCOPIC (Right Wrist)  Patient location during evaluation: PACU Anesthesia Type: General Level of consciousness: awake and alert Pain management: pain level controlled Vital Signs Assessment: post-procedure vital signs reviewed and stable Respiratory status: spontaneous breathing, nonlabored ventilation, respiratory function stable and patient connected to nasal cannula oxygen Cardiovascular status: blood pressure returned to baseline and stable Postop Assessment: no apparent nausea or vomiting Anesthetic complications: no     Last Vitals:  Vitals:   05/09/19 1059 05/09/19 1122  BP: (!) 161/86 (!) 159/88  Pulse: 70 83  Resp: 16 16  Temp: (!) 36.1 C   SpO2: 97% 100%    Last Pain:  Vitals:   05/09/19 1122  TempSrc:   PainSc: [name]

## 2019-05-09 NOTE — Discharge Instructions (Addendum)
Orthopedic discharge instructions: Keep dressing dry and intact. Keep hand elevated above heart level. May shower after dressing removed on postop day 4 (Sunday). Cover sutures with Band-Aids after drying off. Apply ice to affected area frequently. Take Aleve 2 tabs BID with meals for 7-10 days, then as necessary. Take ES Tylenol or pain medication as prescribed when needed.  Return for follow-up in 10-14 days or as scheduled.  AMBULATORY SURGERY  DISCHARGE INSTRUCTIONS   1) The drugs that you were given will stay in your system until tomorrow so for the next 24 hours you should not:  A) Drive an automobile B) Make any legal decisions C) Drink any alcoholic beverage   2) You may resume regular meals tomorrow.  Today it is better to start with liquids and gradually work up to solid foods.  You may eat anything you prefer, but it is better to start with liquids, then soup and crackers, and gradually work up to solid foods.   3) Please notify your doctor immediately if you have any unusual bleeding, trouble breathing, redness and pain at the surgery site, drainage, fever, or pain not relieved by medication.    4) Additional Instructions:        Please contact your physician with any problems or Same Day Surgery at [phone], Monday through Friday 6 am to 4 pm, or [place] at [facility] number at [phone].

## 2019-05-09 NOTE — Transfer of Care (Signed)
Immediate Anesthesia Transfer of Care Note  Patient: Maurice James  Procedure(s) Performed: CARPAL TUNNEL RELEASE ENDOSCOPIC (Right Wrist)  Patient Location: PACU  Anesthesia Type:General  Level of Consciousness: sedated  Airway & Oxygen Therapy: Patient connected to face mask oxygen  Post-op Assessment: Report given to RN and Post -op Vital signs reviewed and stable  Post vital signs: stable  Last Vitals:  Vitals Value Taken Time  BP 136/81 05/09/19 0948  Temp 36.4 C 05/09/19 0948  Pulse 86 05/09/19 0949  Resp 12 05/09/19 0949  SpO2 99 % 05/09/19 0949  Vitals shown include unvalidated device data.  Last Pain:  Vitals:   05/09/19 0708  TempSrc: [place]  PainSc: 8          Complications: No apparent anesthesia complications

## 2019-05-09 NOTE — Anesthesia Preprocedure Evaluation (Addendum)
Anesthesia Evaluation  Patient identified by MRN, date of birth, ID band Patient awake    Reviewed: Allergy & Precautions, NPO status , Patient's Chart, lab work & pertinent test results, reviewed documented beta blocker date and time   Airway Mallampati: III  TM Distance: >3 FB     Dental  (+) Chipped, Partial Lower   Pulmonary sleep apnea and Continuous Positive Airway Pressure Ventilation , former smoker,           Cardiovascular hypertension, Pt. on medications + Valvular Problems/Murmurs      Neuro/Psych  Neuromuscular disease    GI/Hepatic GERD  Controlled,  Endo/Other    Renal/GU      Musculoskeletal  (+) Arthritis ,   Abdominal   Peds  Hematology   Anesthesia Other Findings EKG ok.  Reproductive/Obstetrics                            Anesthesia Physical Anesthesia Plan  ASA: III  Anesthesia Plan: General   Post-op Pain Management:    Induction: Intravenous  PONV Risk Score and Plan:   Airway Management Planned: LMA  Additional Equipment:   Intra-op Plan:   Post-operative Plan:   Informed Consent: I have reviewed the patients History and Physical, chart, labs and discussed the procedure including the risks, benefits and alternatives for the proposed anesthesia with the patient or authorized representative who has indicated his/her understanding and acceptance.       Plan Discussed with: CRNA  Anesthesia Plan Comments:         Anesthesia Quick Evaluation

## 2019-05-09 NOTE — Op Note (Addendum)
05/09/2019  9:56 AM  Patient:   Maurice James  Pre-Op Diagnosis:   Right carpal tunnel syndrome.  Post-Op Diagnosis:   Same.  Procedure:   Endoscopic right carpal tunnel release.  Surgeon:   Pascal Lux, MD  Anesthesia:   General LMA  Findings:   As above.  Complications:   None  EBL:   0 cc  Fluids:   400 cc crystalloid  TT:   25 minutes at 250 mmHg  Drains:   None  Closure:   4-0 Prolene interrupted sutures  Brief Clinical Note:   The patient is a 68 year old male with a history of progressively worsening pain and paresthesias to his right hand. His symptoms have progressed despite medications, activity modification, injections, splinting, etc. His history and examination are consistent with carpal tunnel syndrome which was confirmed by EMG. The patient presents at this time for an endoscopic right carpal tunnel release.   Procedure:   The patient was brought into the operating room and lain in the supine position. After adequate general laryngeal mask anesthesia was obtained, the right hand and upper extremity were prepped with ChloraPrep solution before being draped sterilely. Preoperative antibiotics were administered. A timeout was performed to verify the appropriate surgical site before the limb was exsanguinated with an Esmarch and the tourniquet inflated to 250 mmHg. An approximately 1.5-2 cm incision was made over the volar wrist flexion crease, centered over the palmaris longus tendon. The incision was carried down through the subcutaneous tissues with care taken to identify and protect any neurovascular structures. The distal forearm fascia was penetrated just proximal to the transverse carpal ligament. The soft tissues were released off the superficial and deep surfaces of the distal forearm fascia and this was released proximally for 3-4 cm under direct visualization.  Attention was directed distally. The Soil scientist was passed beneath the transverse carpal  ligament along the ulnar aspect of the carpal tunnel and used to release any adhesions as well as to remove any adherent synovial tissue before first the smaller then the larger of the two dilators were passed beneath the transverse carpal ligament along the ulnar margin of the carpal tunnel. The slotted cannula was introduced and the endoscope was placed into the slotted cannula and the undersurface of the transverse carpal ligament visualized. The distal margin of the transverse carpal ligament was marked by placing a 25-gauge needle percutaneously at [place] cardinal point so that it entered the distal portion of the slotted cannula. Under endoscopic visualization, the transverse carpal ligament was released from proximal to distal using the end-cutting blade. A second pass was performed to ensure complete release of the ligament. The adequacy of release was verified both endoscopically and by palpation using the freer elevator.  The wound was irrigated thoroughly with sterile saline solution before being closed using 4-0 Prolene interrupted sutures. A total of 10 cc of 0.5% plain Sensorcaine was injected in and around the incision before a sterile bulky dressing was applied to the wound. The patient was placed into a volar wrist splint before being awakened and returned to the recovery room in satisfactory condition after tolerating the procedure well.

## 2019-05-09 NOTE — Anesthesia Procedure Notes (Signed)
Procedure Name: LMA Insertion Date/Time: 05/09/2019 8:58 AM Performed by: Chanetta Marshall, CRNA Pre-anesthesia Checklist: Patient identified, Emergency Drugs available, Suction available and Patient being monitored Patient Re-evaluated:Patient Re-evaluated prior to induction Oxygen Delivery Method: Circle system utilized Preoxygenation: Pre-oxygenation with 100% oxygen Induction Type: IV induction Ventilation: Mask ventilation without difficulty LMA: LMA inserted LMA Size: 4.0 Number of attempts: 1 Airway Equipment and Method: Oral airway Placement Confirmation: positive ETCO2,  breath sounds checked- equal and bilateral and CO2 detector Tube secured with: Tape Dental Injury: Teeth and Oropharynx as per pre-operative assessment

## 2019-05-09 NOTE — H&P (Signed)
Paper H&P to be scanned into permanent record. H&P reviewed and patient re-examined. No changes. 

## 2019-05-09 NOTE — Anesthesia Post-op Follow-up Note (Signed)
Anesthesia QCDR form completed.        

## 2021-05-19 ENCOUNTER — Encounter: Payer: Self-pay | Admitting: Internal Medicine

## 2021-05-20 ENCOUNTER — Ambulatory Visit: Payer: Medicare HMO | Admitting: Certified Registered Nurse Anesthetist

## 2021-05-20 ENCOUNTER — Encounter
Admission: RE | Disposition: A | Discharge status: Home / Self Care | Payer: Self-pay | Source: Home / Self Care | Attending: Internal Medicine

## 2021-05-20 ENCOUNTER — Ambulatory Visit
Admission: RE | Admit: 2021-05-20 | Discharge: 2021-05-20 | Disposition: A | Discharge status: Home / Self Care | Payer: Medicare HMO | Attending: Internal Medicine | Admitting: Internal Medicine

## 2021-05-20 DIAGNOSIS — G4730 Sleep apnea, unspecified: Secondary | ICD-10-CM | POA: Insufficient documentation

## 2021-05-20 DIAGNOSIS — E785 Hyperlipidemia, unspecified: Secondary | ICD-10-CM | POA: Diagnosis not present

## 2021-05-20 DIAGNOSIS — R194 Change in bowel habit: Secondary | ICD-10-CM | POA: Diagnosis present

## 2021-05-20 DIAGNOSIS — Q438 Other specified congenital malformations of intestine: Secondary | ICD-10-CM | POA: Diagnosis not present

## 2021-05-20 DIAGNOSIS — Z7982 Long term (current) use of aspirin: Secondary | ICD-10-CM | POA: Diagnosis not present

## 2021-05-20 DIAGNOSIS — K219 Gastro-esophageal reflux disease without esophagitis: Secondary | ICD-10-CM | POA: Diagnosis not present

## 2021-05-20 DIAGNOSIS — Z79899 Other long term (current) drug therapy: Secondary | ICD-10-CM | POA: Insufficient documentation

## 2021-05-20 DIAGNOSIS — I10 Essential (primary) hypertension: Secondary | ICD-10-CM | POA: Insufficient documentation

## 2021-05-20 DIAGNOSIS — K5521 Angiodysplasia of colon with hemorrhage: Secondary | ICD-10-CM | POA: Insufficient documentation

## 2021-05-20 DIAGNOSIS — D125 Benign neoplasm of sigmoid colon: Secondary | ICD-10-CM | POA: Diagnosis not present

## 2021-05-20 DIAGNOSIS — K640 First degree hemorrhoids: Secondary | ICD-10-CM | POA: Diagnosis not present

## 2021-05-20 DIAGNOSIS — Z885 Allergy status to narcotic agent status: Secondary | ICD-10-CM | POA: Insufficient documentation

## 2021-05-20 HISTORY — PX: COLONOSCOPY: SHX5424

## 2021-05-20 SURGERY — COLONOSCOPY
Anesthesia: General

## 2021-05-20 MED ORDER — LIDOCAINE HCL (PF) 2 % IJ SOLN
INTRAMUSCULAR | Status: AC
  Filled 2021-05-20: qty 40

## 2021-05-20 MED ORDER — GLYCOPYRROLATE 0.2 MG/ML IJ SOLN
INTRAMUSCULAR | Status: DC | PRN
  Administered 2021-05-20: .2 mg via INTRAVENOUS

## 2021-05-20 MED ORDER — SODIUM CHLORIDE 0.9 % IV SOLN
INTRAVENOUS | Status: DC
  Administered 2021-05-20: 20 mL/h via INTRAVENOUS

## 2021-05-20 MED ORDER — GLYCOPYRROLATE 0.2 MG/ML IJ SOLN
INTRAMUSCULAR | Status: AC
  Filled 2021-05-20: qty 3

## 2021-05-20 MED ORDER — PROPOFOL 500 MG/50ML IV EMUL
INTRAVENOUS | Status: DC | PRN
  Administered 2021-05-20: 140 ug/kg/min via INTRAVENOUS

## 2021-05-20 MED ORDER — LIDOCAINE HCL (CARDIAC) PF 100 MG/5ML IV SOSY
PREFILLED_SYRINGE | INTRAVENOUS | Status: DC | PRN
  Administered 2021-05-20: 100 mg via INTRAVENOUS

## 2021-05-20 MED ORDER — PROPOFOL 500 MG/50ML IV EMUL
INTRAVENOUS | Status: AC
  Filled 2021-05-20: qty 50

## 2021-05-20 MED ORDER — PROPOFOL 10 MG/ML IV BOLUS
INTRAVENOUS | Status: DC | PRN
  Administered 2021-05-20: 60 mg via INTRAVENOUS
  Administered 2021-05-20 (×2): 20 mg via INTRAVENOUS

## 2021-05-20 NOTE — Interval H&P Note (Signed)
History and Physical Interval Note:  05/20/2021 10:50 AM  Maurice James  has presented today for surgery, with the diagnosis of (K92.1) HEMATOCHEZIA (R19.4) CHANGE IN BOWEL HABITS.  The various methods of treatment have been discussed with the patient and family. After consideration of risks, benefits and other options for treatment, the patient has consented to  Procedure(s): COLONOSCOPY (N/A) as a surgical intervention.  The patient's history has been reviewed, patient examined, no change in status, stable for surgery.  I have reviewed the patient's chart and labs.  Questions were answered to the patient's satisfaction.     [place], [place]

## 2021-05-20 NOTE — Op Note (Signed)
[facility] Patient Name: Maurice James Procedure Date: 05/20/2021 10:45 AM MRN: [id number] Account #: [id number] Date of Birth: August 11, 1951 Admit Type: Outpatient Age: 70 Room: [facility] ENDO ROOM 2 Gender: Male Note Status: Finalized Instrument Name: Jasper Riling [id number] Procedure:             Colonoscopy Indications:           Hematochezia, Change in bowel habits Providers:             Benay Pike. Abel Ra MD, MD Medicines:             Propofol per Anesthesia Complications:         No immediate complications. Procedure:             Pre-Anesthesia Assessment:                        - The risks and benefits of the procedure and the                         sedation options and risks were discussed with the                         patient. All questions were answered and informed                         consent was obtained.                        - Patient identification and proposed procedure were                         verified prior to the procedure by the nurse. The                         procedure was verified in the procedure room.                        - ASA Grade Assessment: III - A patient with severe                         systemic disease.                        - After reviewing the risks and benefits, the patient                         was deemed in satisfactory condition to undergo the                         procedure.                        After obtaining informed consent, the colonoscope was                         passed under direct vision. Throughout the procedure,                         the patient's blood pressure, pulse, and oxygen  saturations were monitored continuously. The                         Colonoscope was introduced through the anus and                         advanced to the the cecum, identified by appendiceal                         orifice and ileocecal valve. The colonoscopy was                          technically difficult and complex due to a redundant                         colon, significant looping and a tortuous colon.                         Successful completion of the procedure was aided by                         straightening and shortening the scope to obtain bowel                         loop reduction and applying abdominal pressure. The                         patient tolerated the procedure well. The quality of                         the bowel preparation was good. The ileocecal valve,                         appendiceal orifice, and rectum were photographed. Findings:      The perianal and digital rectal examinations were normal. Pertinent       negatives include normal sphincter tone and no palpable rectal lesions.      A 5 mm polyp was found in the sigmoid colon. The polyp was sessile. The       polyp was removed with a jumbo cold forceps. Resection and retrieval       were complete.      The colon (entire examined portion) was significantly tortuous.       Advancing the scope required straightening and shortening the scope to       obtain bowel loop reduction. Estimated blood loss: none.      Non-bleeding internal hemorrhoids were found during retroflexion. The       hemorrhoids were Grade I (internal hemorrhoids that do not prolapse).      A few small localized angioectasias without bleeding were found in the       cecum.      The exam was otherwise without abnormality. Impression:            - One 5 mm polyp in the sigmoid colon, removed with a                         jumbo cold forceps. Resected and retrieved.                        -  Tortuous colon.                        - Non-bleeding internal hemorrhoids.                        - A few non-bleeding colonic angioectasias.                        - The examination was otherwise normal. Recommendation:        - Patient has a contact number available for                         emergencies. The  signs and symptoms of potential                         delayed complications were discussed with the patient.                         Return to normal activities tomorrow. Written                         discharge instructions were provided to the patient.                        - High fiber diet.                        - Continue present medications.                        - Await pathology results.                        - If polyps are benign or adenomatous without                         dysplasia, I will advise NO further colonoscopy due to                         advanced age and/or severe comorbidity.                        - Return to GI office PRN.                        - The findings and recommendations were discussed with                         the patient. Procedure Code(s):     --- Professional ---                        [phone], Colonoscopy, flexible; with biopsy, single or                         multiple Diagnosis Code(s):     --- Professional ---                        Q43.8, Other specified congenital malformations of  intestine                        R19.4, Change in bowel habit                        K92.1, Melena (includes Hematochezia)                        K64.0, First degree hemorrhoids                        K55.20, Angiodysplasia of colon without hemorrhage                        K63.5, Polyp of colon CPT copyright 2019 American Medical Association. All rights reserved. The codes documented in this report are preliminary and upon coder review may  be revised to meet current compliance requirements. Efrain Sella MD, MD 05/20/2021 11:29:58 AM This report has been signed electronically. Number of Addenda: 0 Note Initiated On: 05/20/2021 10:45 AM Scope Withdrawal Time: 0 hours 6 minutes 59 seconds  Total Procedure Duration: 0 hours 24 minutes 3 seconds  Estimated Blood Loss:  Estimated blood loss: none. Estimated blood loss: none.       [facility]

## 2021-05-20 NOTE — Anesthesia Preprocedure Evaluation (Signed)
Anesthesia Evaluation  Patient identified by MRN, date of birth, ID band Patient awake    Reviewed: Allergy & Precautions, NPO status , Patient's Chart, lab work & pertinent test results, reviewed documented beta blocker date and time   Airway Mallampati: III  TM Distance: >3 FB     Dental  (+) Partial Lower, Missing,    Pulmonary sleep apnea and Continuous Positive Airway Pressure Ventilation , former smoker,    Pulmonary exam normal        Cardiovascular Exercise Tolerance: Good hypertension, Pt. on medications Normal cardiovascular exam+ Valvular Problems/Murmurs  Rhythm:Regular     Neuro/Psych  Neuromuscular disease negative psych ROS   GI/Hepatic Neg liver ROS, GERD  Controlled,  Endo/Other  negative endocrine ROS  Renal/GU negative Renal ROS     Musculoskeletal  (+) Arthritis ,   Abdominal Normal abdominal exam  (+)   Peds  Hematology negative hematology ROS (+)   Anesthesia Other Findings EKG ok.  Reproductive/Obstetrics                            Anesthesia Physical  Anesthesia Plan  ASA: III  Anesthesia Plan: General   Post-op Pain Management:    Induction: Intravenous  PONV Risk Score and Plan: Treatment may vary due to age or medical condition and Propofol infusion  Airway Management Planned: Nasal Cannula and Natural Airway  Additional Equipment:   Intra-op Plan:   Post-operative Plan:   Informed Consent: I have reviewed the patients History and Physical, chart, labs and discussed the procedure including the risks, benefits and alternatives for the proposed anesthesia with the patient or authorized representative who has indicated his/her understanding and acceptance.     Dental advisory given  Plan Discussed with: CRNA  Anesthesia Plan Comments:        Anesthesia Quick Evaluation

## 2021-05-20 NOTE — H&P (Signed)
Outpatient short stay form Pre-procedure 05/20/2021 10:48 AM Felicia Bloomquist K. Alice Reichert, M.D.  Primary Physician: Thereasa Distance, M.D.  Reason for visit:  Change in bowel habits, hematochezia, Dysphagia, GERD  History of present illness: Mr. Ovens is a pleasant 69 year old man with a history of recent lower abdominal cramping, change in bowel habits (constipation) and intermittent symptoms of hematochezia.  Patient also has history of GERD and dysphagia with remote history of esophageal dilatation of a Schatzki ring in 2016. Was recommended the patient take MiraLAX daily as well as lactulose as needed for resistant constipation symptoms.  He has a remote history of colon polyps as well.    Current Facility-Administered Medications:    0.9 %  sodium chloride infusion, , Intravenous, Continuous, [place], Benay Pike, MD, Last Rate: 20 mL/hr at 05/20/21 1029, 20 mL/hr at 05/20/21 1029  Medications Prior to Admission  Medication Sig Dispense Refill Last Dose   acetaminophen (TYLENOL) 500 MG tablet Take 1,000 mg by mouth daily as needed for moderate pain.   05/19/2021   aspirin EC 81 MG tablet Take 81 mg by mouth every evening.    05/19/2021   carbamazepine (TEGRETOL) 200 MG tablet Take 200 mg by mouth every morning.    05/19/2021   cetirizine (ZYRTEC) 10 MG tablet Take 10 mg by mouth at bedtime.   05/19/2021   FLUoxetine (PROZAC) 10 MG capsule Take 10 mg by mouth every morning.    05/19/2021   hydrochlorothiazide (MICROZIDE) 12.5 MG capsule Take 12.5 mg by mouth every morning.    05/19/2021   losartan (COZAAR) 50 MG tablet Take 50 mg by mouth every morning.    05/19/2021   methocarbamol (ROBAXIN) 750 MG tablet Take 750 mg by mouth daily as needed for muscle spasms.    05/19/2021   naproxen (NAPROSYN) 500 MG tablet Take 500 mg by mouth daily as needed for mild pain.    05/19/2021   naproxen sodium (ALEVE) 220 MG tablet Take 440 mg by mouth daily as needed (pain).   05/19/2021   pantoprazole (PROTONIX) 40 MG tablet  Take 40 mg by mouth every morning.    05/19/2021   Probiotic CAPS Take 1 capsule by mouth daily as needed (upset stomach).   05/19/2021   simvastatin (ZOCOR) 20 MG tablet Take 20 mg by mouth at bedtime.   05/19/2021     Allergies  Allergen Reactions   Codeine Itching   Vicodin [Hydrocodone-Acetaminophen] Itching     Past Medical History:  Diagnosis Date   DDD (degenerative disc disease), cervical    Diverticulitis    Dysphagia    GERD (gastroesophageal reflux disease)    Heart murmur    AS A CHILD   Hyperlipidemia    Hypertension    Sleep apnea    USES CPAP   Trigeminal neuralgia     Review of systems:  Otherwise negative.    Physical Exam  Gen: Alert, oriented. Appears stated age.  HEENT: [place]/AT. PERRLA. Lungs: CTA, no wheezes. CV: RR nl S1, S2. Abd: soft, benign, no masses. BS+ Ext: No edema. Pulses 2+    Planned procedures: Proceed with EGD and colonoscopy. The patient understands the nature of the planned procedure, indications, risks, alternatives and potential complications including but not limited to bleeding, infection, perforation, damage to internal organs and possible oversedation/side effects from anesthesia. The patient agrees and gives consent to proceed.  Please refer to procedure notes for findings, recommendations and patient disposition/instructions.     Haeden Hudock K. Alice Reichert, M.D. Gastroenterology 05/20/2021  10:48 AM

## 2021-05-20 NOTE — Anesthesia Procedure Notes (Signed)
Date/Time: 05/20/2021 10:59 AM Performed by: Lily Peer, Maia Handa, CRNA Pre-anesthesia Checklist: Patient identified, Emergency Drugs available, Suction available, Patient being monitored and Timeout performed Patient Re-evaluated:Patient Re-evaluated prior to induction Oxygen Delivery Method: Nasal cannula Induction Type: IV induction

## 2021-05-20 NOTE — Transfer of Care (Signed)
Immediate Anesthesia Transfer of Care Note  Patient: Maurice James  Procedure(s) Performed: COLONOSCOPY  Patient Location: Endoscopy Unit  Anesthesia Type:General  Level of Consciousness: drowsy  Airway & Oxygen Therapy: Patient Spontanous Breathing  Post-op Assessment: Report given to RN and Post -op Vital signs reviewed and stable  Post vital signs: Reviewed and stable  Last Vitals:  Vitals Value Taken Time  BP 115/61 05/20/21 1127  Temp 36.1 C 05/20/21 1127  Pulse 61 05/20/21 1128  Resp 16 05/20/21 1128  SpO2 96 % 05/20/21 1128  Vitals shown include unvalidated device data.  Last Pain:  Vitals:   05/20/21 1127  TempSrc: Temporal  PainSc: Asleep         Complications: No notable events documented.

## 2021-05-21 ENCOUNTER — Encounter: Payer: Self-pay | Admitting: Internal Medicine

## 2021-05-21 LAB — SURGICAL PATHOLOGY

## 2021-05-22 NOTE — Anesthesia Postprocedure Evaluation (Signed)
Anesthesia Post Note  Patient: Maurice James  Procedure(s) Performed: COLONOSCOPY  Patient location during evaluation: Endoscopy Anesthesia Type: General Level of consciousness: awake and alert Pain management: pain level controlled Vital Signs Assessment: post-procedure vital signs reviewed and stable Respiratory status: spontaneous breathing, nonlabored ventilation and respiratory function stable Cardiovascular status: blood pressure returned to baseline and stable Postop Assessment: no apparent nausea or vomiting Anesthetic complications: no   No notable events documented.   Last Vitals:  Vitals:   05/20/21 1147 05/20/21 1157  BP: (!) 155/93 (!) 163/87  Pulse: (!) 59 (!) 49  Resp:    Temp:    SpO2: 99% 99%    Last Pain:  Vitals:   05/21/21 0735  TempSrc:   PainSc: 0-No pain                 [place] Ouch

## 2024-01-28 ENCOUNTER — Ambulatory Visit
Admission: EM | Admit: 2024-01-28 | Discharge: 2024-01-28 | Disposition: A | Discharge status: Home / Self Care | Attending: Family Medicine | Admitting: Family Medicine

## 2024-01-28 DIAGNOSIS — R197 Diarrhea, unspecified: Secondary | ICD-10-CM | POA: Insufficient documentation

## 2024-01-28 DIAGNOSIS — R509 Fever, unspecified: Secondary | ICD-10-CM | POA: Diagnosis present

## 2024-01-28 DIAGNOSIS — R112 Nausea with vomiting, unspecified: Secondary | ICD-10-CM | POA: Insufficient documentation

## 2024-01-28 LAB — URINALYSIS, W/ REFLEX TO CULTURE (INFECTION SUSPECTED)
Bilirubin Urine: NEGATIVE
Glucose, UA: NEGATIVE mg/dL
Hgb urine dipstick: NEGATIVE
Leukocytes,Ua: NEGATIVE
Nitrite: NEGATIVE
Specific Gravity, Urine: 1.015 (ref 1.005–1.030)
pH: 7 (ref 5.0–8.0)

## 2024-01-28 LAB — CBC WITH DIFFERENTIAL/PLATELET
Abs Immature Granulocytes: 0.02 10*3/uL (ref 0.00–0.07)
Basophils Absolute: 0 10*3/uL (ref 0.0–0.1)
Basophils Relative: 0 %
Eosinophils Absolute: 0.1 10*3/uL (ref 0.0–0.5)
Eosinophils Relative: 1 %
HCT: 41.5 % (ref 39.0–52.0)
Hemoglobin: 14.3 g/dL (ref 13.0–17.0)
Immature Granulocytes: 0 %
Lymphocytes Relative: 3 %
Lymphs Abs: 0.3 10*3/uL — ABNORMAL LOW (ref 0.7–4.0)
MCH: 31.3 pg (ref 26.0–34.0)
MCHC: 34.5 g/dL (ref 30.0–36.0)
MCV: 90.8 fL (ref 80.0–100.0)
Monocytes Absolute: 0.4 10*3/uL (ref 0.1–1.0)
Monocytes Relative: 4 %
Neutro Abs: 9.1 10*3/uL — ABNORMAL HIGH (ref 1.7–7.7)
Neutrophils Relative %: 92 %
Platelets: 202 10*3/uL (ref 150–400)
RBC: 4.57 MIL/uL (ref 4.22–5.81)
RDW: 13 % (ref 11.5–15.5)
WBC: 9.9 10*3/uL (ref 4.0–10.5)
nRBC: 0 % (ref 0.0–0.2)

## 2024-01-28 LAB — BASIC METABOLIC PANEL WITH GFR
Anion gap: 9 (ref 5–15)
BUN: 23 mg/dL (ref 8–23)
CO2: 25 mmol/L (ref 22–32)
Calcium: 9.1 mg/dL (ref 8.9–10.3)
Chloride: 100 mmol/L (ref 98–111)
Creatinine, Ser: 1.09 mg/dL (ref 0.61–1.24)
GFR, Estimated: >60 mL/min (ref >60)
Glucose, Bld: 117 mg/dL — ABNORMAL HIGH (ref 70–99)
Potassium: 4.5 mmol/L (ref 3.5–5.1)
Sodium: 134 mmol/L — ABNORMAL LOW (ref 135–145)

## 2024-01-28 LAB — RESP PANEL BY RT-PCR (FLU A&B, COVID) ARPGX2
Influenza A by PCR: NEGATIVE
Influenza B by PCR: NEGATIVE
SARS Coronavirus 2 by RT PCR: NEGATIVE

## 2024-01-28 MED ORDER — ONDANSETRON 8 MG PO TBDP
8.0000 mg | ORAL_TABLET | Freq: Once | ORAL | Status: AC
  Administered 2024-01-28: 8 mg via ORAL

## 2024-01-28 MED ORDER — ONDANSETRON HCL 4 MG PO TABS
4.0000 mg | ORAL_TABLET | Freq: Three times a day (TID) | ORAL | Status: DC | PRN
Start: 2024-01-28 — End: 2024-01-29

## 2024-01-28 MED ORDER — ACETAMINOPHEN 325 MG PO TABS
650.0000 mg | ORAL_TABLET | Freq: Once | ORAL | Status: AC
  Administered 2024-01-28: 650 mg via ORAL

## 2024-01-28 MED ORDER — ONDANSETRON HCL 4 MG PO TABS
4.0000 mg | ORAL_TABLET | Freq: Three times a day (TID) | ORAL | Status: DC | PRN
Start: 2024-01-28 — End: 2024-01-28

## 2024-01-28 MED ORDER — ACETAMINOPHEN 500 MG PO TABS: 500.0000 mg | ORAL_TABLET | Freq: Once | ORAL | Status: DC

## 2024-01-28 NOTE — ED Provider Notes (Signed)
 [facility]    CSN: [id number] Arrival date & time: 01/28/24  1327      History   Chief Complaint Chief Complaint  Patient presents with   Emesis   Fever    HPI Maurice James is a 73 y.o. male.   The history is provided by the patient.  Emesis Severity:  Moderate Duration:  5 hours Timing:  Intermittent Number of daily episodes:  3 Quality:  Undigested food Progression:  Unchanged Chronicity:  New Recent urination:  Normal Context: not post-tussive and not self-induced   Ineffective treatments:  None tried Associated symptoms: chills, diarrhea and fever   Associated symptoms: no abdominal pain   Risk factors: no alcohol use, no prior abdominal surgery, no sick contacts, no suspect food intake and no travel to endemic areas   Fever Associated symptoms: chills, diarrhea and vomiting   Diarrhea:    Quality:  Watery   Number of occurrences:  2   Severity:  Moderate   Duration:  5 hours   Timing:  Intermittent   Progression:  Unchanged Risk factors: no contaminated food, no immunosuppression, no occupational exposure, no recent sickness, no recent travel and no sick contacts     Past Medical History:  Diagnosis Date   DDD (degenerative disc disease), cervical    Diverticulitis    Dysphagia    GERD (gastroesophageal reflux disease)    Heart murmur    AS A CHILD   Hyperlipidemia    Hypertension    Sleep apnea    USES CPAP   Trigeminal neuralgia     There are no active problems to display for this patient.   Past Surgical History:  Procedure Laterality Date   CARPAL TUNNEL RELEASE Right 05/09/2019   Procedure: CARPAL TUNNEL RELEASE ENDOSCOPIC;  Surgeon: Elner Hahn, MD;  Location: [facility] ORS;  Service: Orthopedics;  Laterality: Right;   COLONOSCOPY N/A 05/20/2021   Procedure: COLONOSCOPY;  Surgeon: Toledo, Alphonsus Jeans, MD;  Location: [facility] ENDOSCOPY;  Service: Gastroenterology;  Laterality: N/A;   COLONOSCOPY WITH PROPOFOL  N/A 08/14/2015    Procedure: COLONOSCOPY WITH PROPOFOL ;  Surgeon: Cassie Click, MD;  Location: [facility] ENDOSCOPY;  Service: Endoscopy;  Laterality: N/A;   ESOPHAGOGASTRODUODENOSCOPY (EGD) WITH PROPOFOL  N/A 08/14/2015   Procedure: ESOPHAGOGASTRODUODENOSCOPY (EGD) WITH PROPOFOL ;  Surgeon: Cassie Click, MD;  Location: [facility] END[facility]COPY;  Service: Endoscopy;  Laterality: N/A;   KNEE ARTHROSCOPY Left    NOSE SURGERY     Turbinectomy   SHOULDER ARTHROSCOPY Left    X 2   TYMPANOPLASTY     WISDOM TOOTH EXTRACTION         Home Medications    Prior to Admission medications   Medication Sig Start Date End Date Taking? Authorizing Provider  amLODipine (NORVASC) 2.5 MG tablet Take 1 tablet by mouth daily. 12/26/23  Yes [provider]  aspirin EC 81 MG tablet Take 81 mg by mouth every evening.    Yes [provider]  carbamazepine (TEGRETOL) 200 MG tablet Take 200 mg by mouth every morning.    Yes [provider]  cetirizine (ZYRTEC) 10 MG tablet Take 10 mg by mouth at bedtime.   Yes [provider]  FLUoxetine (PROZAC) 10 MG capsule Take 10 mg by mouth every morning.    Yes [provider]  losartan (COZAAR) 50 MG tablet Take 50 mg by mouth every morning.    Yes [provider]  pantoprazole (PROTONIX) 40 MG tablet Take 40 mg by mouth  every morning.    Yes [provider]  triamcinolone cream (KENALOG) 0.1 % Apply topically 2 (two) times daily. 10/11/22  Yes [provider]  acetaminophen  (TYLENOL ) 500 MG tablet Take 1,000 mg by mouth daily as needed for moderate pain.   Yes [provider]  hydrochlorothiazide (MICROZIDE) 12.5 MG capsule Take 12.5 mg by mouth every morning.     [provider]  methocarbamol (ROBAXIN) 750 MG tablet Take 750 mg by mouth daily as needed for muscle spasms.     [provider]  metoprolol succinate (TOPROL-XL) 50 MG 24 hr tablet Take 50 mg by mouth daily.   Yes [provider]   naproxen (NAPROSYN) 500 MG tablet Take 500 mg by mouth daily as needed for mild pain.     [provider]  naproxen sodium (ALEVE) 220 MG tablet Take 440 mg by mouth daily as needed (pain).    [provider]  ondansetron  (ZOFRAN ) 4 MG tablet Take 1 tablet (4 mg total) by mouth every 8 (eight) hours as needed for up to 12 doses for nausea or vomiting. 01/28/24   Reesha Debes, MD  Probiotic CAPS Take 1 capsule by mouth daily as needed (upset stomach).    [provider]  rosuvastatin (CRESTOR) 10 MG tablet Take 10 mg by mouth daily.   Yes [provider]  simvastatin (ZOCOR) 20 MG tablet Take 20 mg by mouth at bedtime.    [provider]    Family History History reviewed. No pertinent family history.  Social History Social History   Tobacco Use   Smoking status: Former    Current packs/day: 0.00    Average packs/day: 1 pack/day for 6.0 years (6.0 ttl pk-yrs)    Types: Cigarettes    Start date: 05/03/1967    Quit date: 05/02/1973    Years since quitting: 50.7   Smokeless tobacco: Never  Vaping Use   Vaping status: Never Used  Substance Use Topics   Alcohol use: Yes    Comment: 3 DRINKS WEEKLY   Drug use: Never     Allergies   Dexamethasone , Codeine, and Vicodin [hydrocodone-acetaminophen ]   Review of Systems Review of Systems  Constitutional:  Positive for chills and fever.  Gastrointestinal:  Positive for diarrhea and vomiting. Negative for abdominal pain.  All other systems reviewed and are negative.    Physical Exam Triage Vital Signs ED Triage Vitals  Encounter Vitals Group     BP 01/28/24 1344 (!) 129/95     Systolic BP Percentile --      Diastolic BP Percentile --      Pulse Rate 01/28/24 1344 75     Resp 01/28/24 1344 16     Temp 01/28/24 1344 99.8 F (37.7 C)     Temp Source 01/28/24 1344 Oral     SpO2 01/28/24 1344 99 %     Weight --      Height --      Head Circumference --      Peak Flow --       Pain Score 01/28/24 1342 4     Pain Loc --      Pain Education --      Exclude from Growth Chart --    No data found.  Updated Vital Signs BP (!) 129/95 (BP Location: Left Arm)   Pulse 75   Temp 99.8 F (37.7 C) (Oral)   Resp 16   SpO2 99%   Visual Acuity Right Eye  Distance:   Left Eye Distance:   Bilateral Distance:    Right Eye Near:   Left Eye Near:    Bilateral Near:     Physical Exam Vitals reviewed.  Constitutional:      Appearance: Normal appearance.  HENT:     Head: Normocephalic and atraumatic.     Nose: Nose normal.  Cardiovascular:     Rate and Rhythm: Normal rate and regular rhythm.  Pulmonary:     Effort: Pulmonary effort is normal.     Breath sounds: Normal breath sounds.  Abdominal:     General: Abdomen is flat. There is no distension.     Palpations: There is no mass.     Tenderness: There is abdominal tenderness. There is no right CVA tenderness, left CVA tenderness, guarding or rebound.     Hernia: No hernia is present.     Comments: Epigastric tenderness  Musculoskeletal:     Cervical back: Neck supple.  Neurological:     General: No focal deficit present.     Mental Status: He is alert.  Psychiatric:        Mood and Affect: Mood normal.      UC Treatments / Results  Labs (all labs ordered are listed, but only abnormal results are displayed) Labs Reviewed  BASIC METABOLIC PANEL WITH GFR - Abnormal; Notable for the following components:      Result Value   Sodium 134 (*)    Glucose, Bld 117 (*)    All other components within normal limits  CBC WITH DIFFERENTIAL/PLATELET - Abnormal; Notable for the following components:   Neutro Abs 9.1 (*)    Lymphs Abs 0.3 (*)    All other components within normal limits  URINALYSIS, W/ REFLEX TO CULTURE (INFECTION SUSPECTED) - Abnormal; Notable for the following components:   Ketones, ur TRACE (*)    Protein, ur TRACE (*)    Bacteria, UA FEW (*)    All other components within normal limits   RESP PANEL BY RT-PCR (FLU A&B, COVID) ARPGX2    EKG   Radiology No results found.  Procedures Procedures (including critical care time)  Medications Ordered in UC Medications  ondansetron  (ZOFRAN -ODT) disintegrating tablet 8 mg (8 mg Oral Given 01/28/24 1411)  acetaminophen  (TYLENOL ) tablet 650 mg (650 mg Oral Given 01/28/24 1511)    Initial Impression / Assessment and Plan / UC Course  I have reviewed the triage vital signs and the nursing notes.  Pertinent labs & imaging results that were available during my care of the patient were reviewed by me and considered in my medical decision making (see chart for details).      Final Clinical Impressions(s) / UC Diagnoses   Final diagnoses:  Nausea vomiting and diarrhea     Discharge Instructions      Suspecting viral etiology. Flu COVID-negative. CBC Chem-8 UA did not show any acute process other than mild dehydration Brat diet, Zofran , aggressive oral hydration, 48 hours follow-up if symptomatic.   ED Prescriptions     Medication Sig Dispense Auth. Provider   ondansetron  (ZOFRAN ) 4 MG tablet  (Status: Discontinued) Take 1 tablet (4 mg total) by mouth every 8 (eight) hours as needed for up to 12 doses for nausea or vomiting. -- Kaidynce Pfister, MD   ondansetron  (ZOFRAN ) 4 MG tablet Take 1 tablet (4 mg total) by mouth every 8 (eight) hours as needed for up to 12 doses for nausea or vomiting. -- Billey Wojciak, MD  PDMP not reviewed this encounter.   Burch Marchuk, MD 01/28/24 [phone]

## 2024-01-28 NOTE — Discharge Instructions (Signed)
 Suspecting viral etiology. Flu COVID-negative. CBC Chem-8 UA did not show any acute process other than mild dehydration Brat diet, Zofran , aggressive oral hydration, 48 hours follow-up if symptomatic.

## 2024-01-28 NOTE — ED Triage Notes (Signed)
 Sx started this morning  Feverish Diarrhea Cold  Vomiting  Bodyaches  Back and hips aching   Testing for covid flu

## 2024-01-29 ENCOUNTER — Telehealth: Payer: Self-pay

## 2024-01-29 MED ORDER — ONDANSETRON HCL 4 MG PO TABS: 4.0000 mg | ORAL_TABLET | Freq: Three times a day (TID) | ORAL | Status: DC | PRN

## 2024-01-29 MED ORDER — ONDANSETRON HCL 4 MG PO TABS: 4.0000 mg | ORAL_TABLET | Freq: Three times a day (TID) | ORAL | 0 refills | Status: DC | PRN

## 2024-01-29 MED ORDER — ONDANSETRON HCL 4 MG PO TABS: 4.0000 mg | ORAL_TABLET | Freq: Three times a day (TID) | ORAL | 0 refills | Status: AC | PRN

## 2024-01-29 NOTE — Telephone Encounter (Signed)
 Zofran  prescription sent to pharmacy.   Fidel Huddle, DO

## 2024-01-29 NOTE — Addendum Note (Signed)
 Addended by: Fidel Huddle D on: 01/29/2024 10:39 AM   Modules accepted: Orders

## 2024-01-30 ENCOUNTER — Ambulatory Visit (HOSPITAL_COMMUNITY): Payer: Self-pay
